# Patient Record
Sex: Male | Born: 1990 | Race: Black or African American | Hispanic: No | Marital: Single | Smoking: Current every day smoker
Health system: Southern US, Community
[De-identification: ages and names within clinical notes are randomized; demographics above are authoritative.]

## PROBLEM LIST (undated history)

## (undated) DIAGNOSIS — F32A Depression, unspecified: Secondary | ICD-10-CM

## (undated) DIAGNOSIS — F329 Major depressive disorder, single episode, unspecified: Secondary | ICD-10-CM

---
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
## 2011-06-16 ENCOUNTER — Emergency Department: Payer: Self-pay | Admitting: Emergency Medicine

## 2011-07-08 IMAGING — CR DG WRIST COMPLETE 3+V*R*
1 series · 4 of 4 positions shown · non-contrast
Comparison: none

REASON FOR EXAM: pain
COMMENTS:

[Series 1: view not recorded · 0.17mm/px · 4 of 4 slices shown]
[im 1/4]
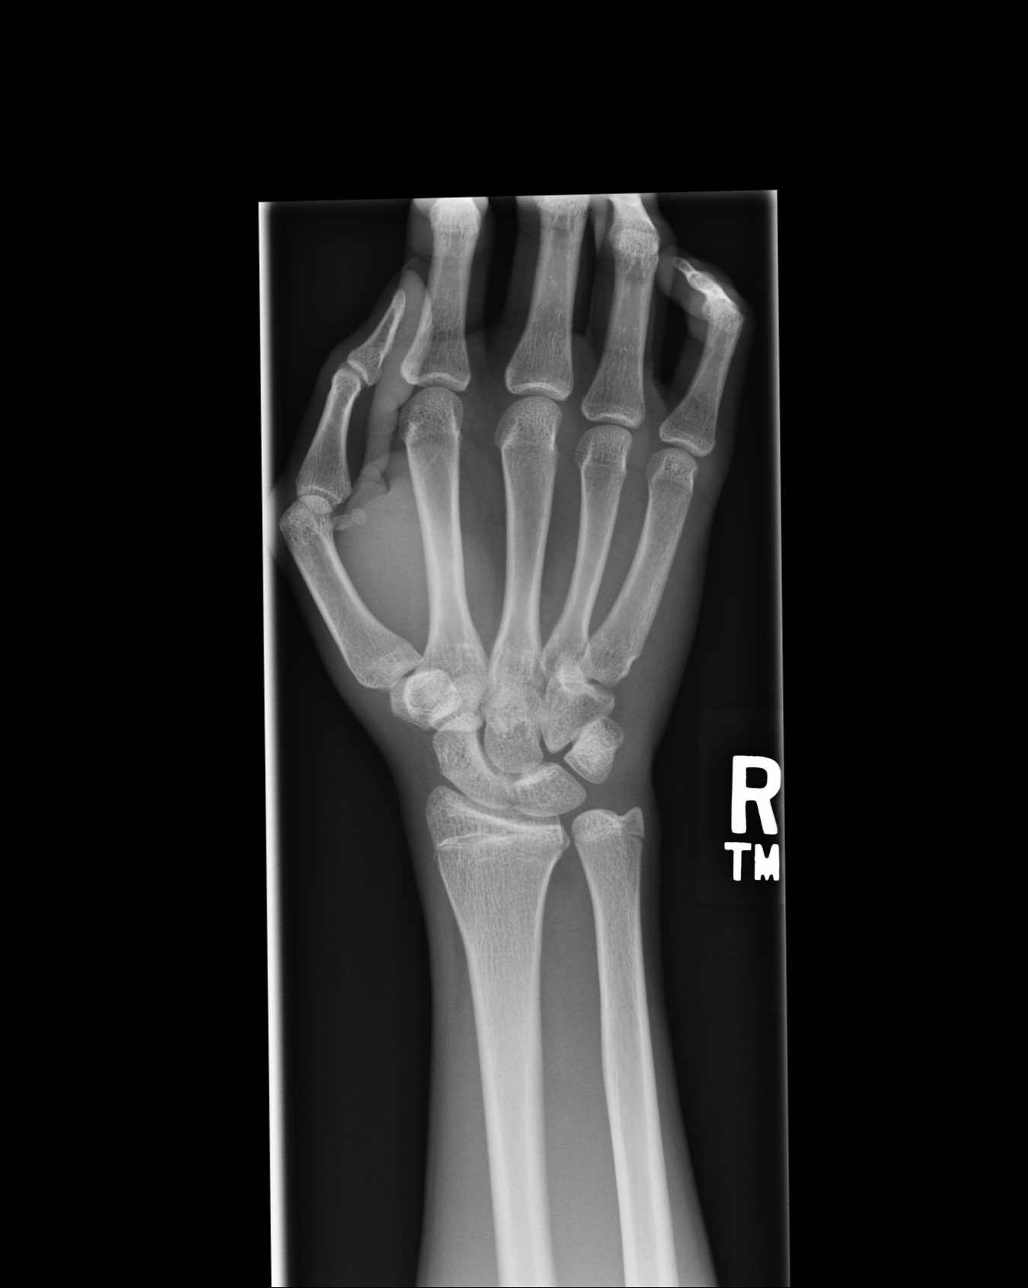
[im 2/4]
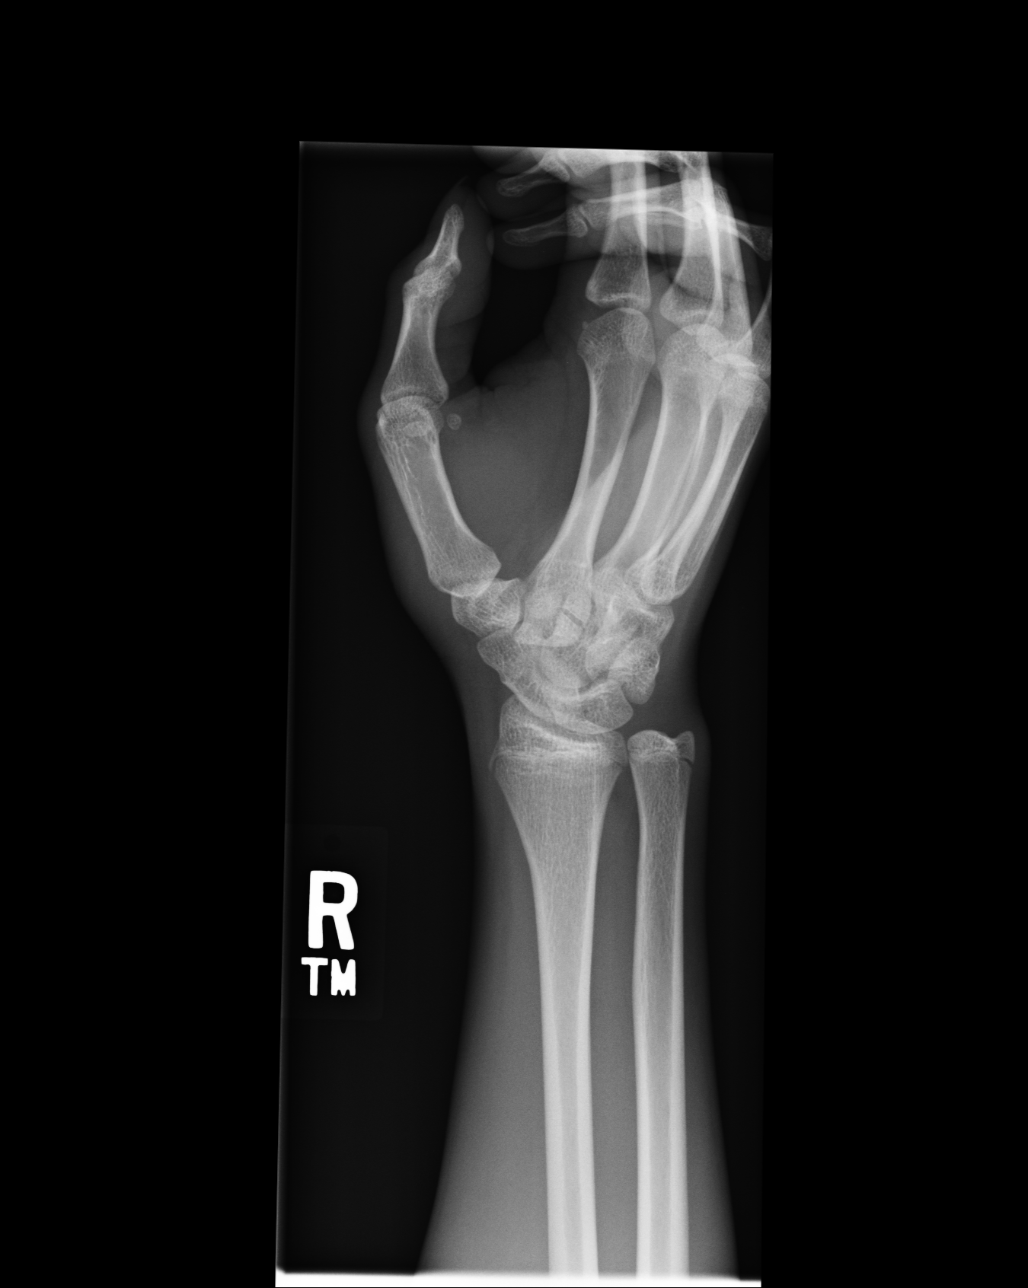
[im 3/4]
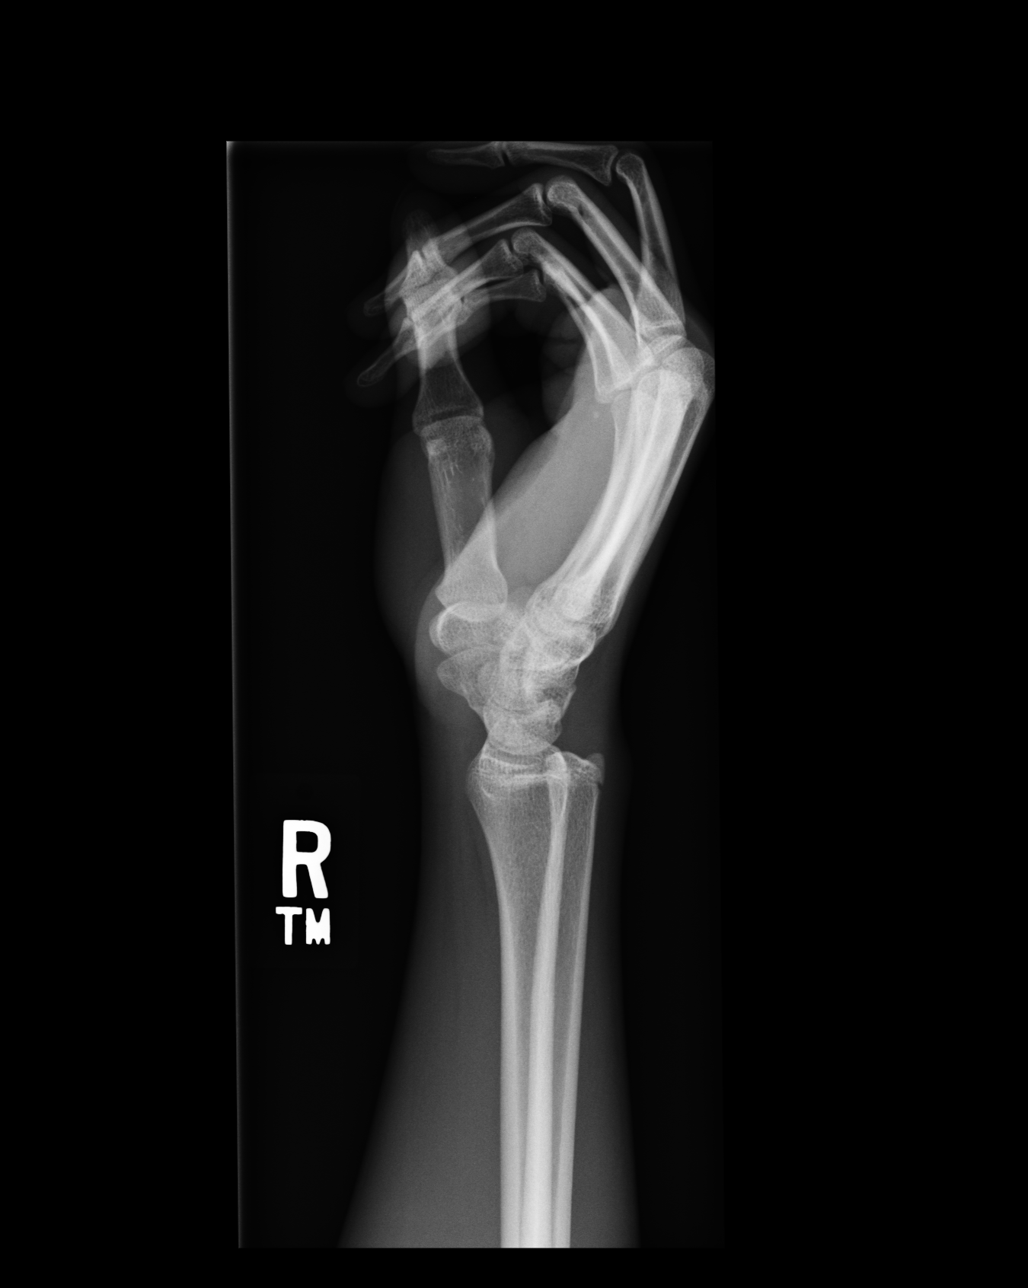
[im 4/4]
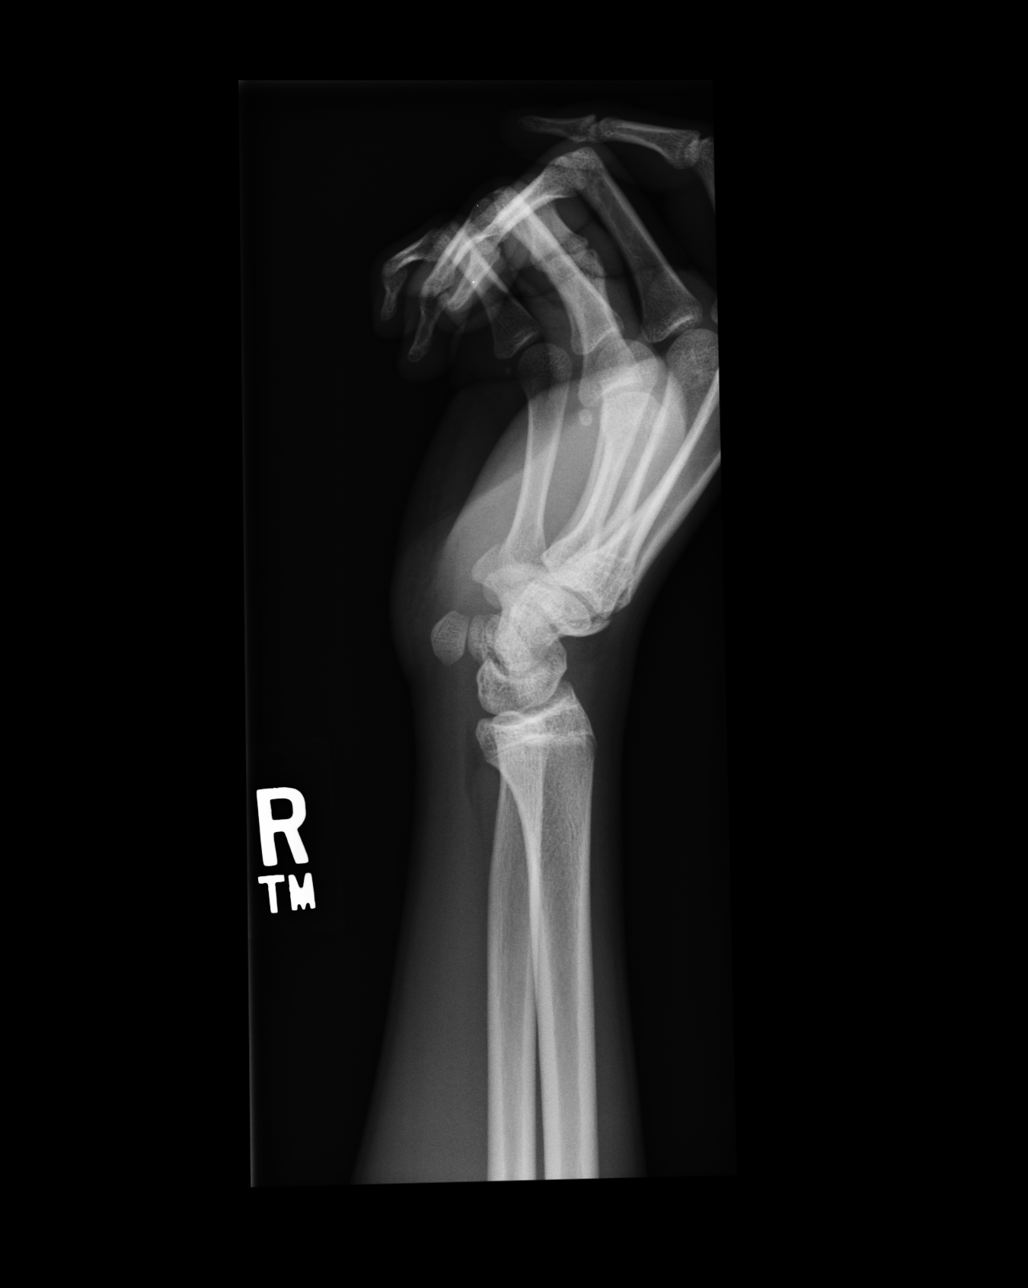

[4 of 4 positions shown; findings below may reference images not displayed]

PROCEDURE:     DXR - DXR WRIST RT COMP WITH OBLIQUES  - November 28, 2009 [DATE]

RESULT:     Images of the right wrist demonstrate no definite fracture,
dislocation or radiopaque foreign body. A fracture through the physis or
growth p[REDACTED] be difficult to detect initially. No subluxation is
appreciated. If the patient has persistent or worsening symptoms then repeat
images would be recommended.
IMPRESSION: No acute bony abnormality evident. Please see above.

## 2012-01-03 ENCOUNTER — Emergency Department: Payer: Self-pay | Admitting: Emergency Medicine

## 2012-04-26 ENCOUNTER — Emergency Department: Payer: Self-pay | Admitting: Emergency Medicine

## 2012-05-29 ENCOUNTER — Encounter (HOSPITAL_COMMUNITY): Payer: Self-pay | Admitting: Emergency Medicine

## 2012-05-29 ENCOUNTER — Emergency Department (HOSPITAL_COMMUNITY)
Admission: EM | Admit: 2012-05-29 | Discharge: 2012-05-29 | Disposition: A | Discharge status: Home / Self Care | Payer: Self-pay | Attending: Emergency Medicine | Admitting: Emergency Medicine

## 2012-05-29 DIAGNOSIS — S50909A Unspecified superficial injury of unspecified elbow, initial encounter: Secondary | ICD-10-CM | POA: Insufficient documentation

## 2012-05-29 DIAGNOSIS — F17200 Nicotine dependence, unspecified, uncomplicated: Secondary | ICD-10-CM | POA: Insufficient documentation

## 2012-05-29 DIAGNOSIS — F329 Major depressive disorder, single episode, unspecified: Secondary | ICD-10-CM | POA: Insufficient documentation

## 2012-05-29 DIAGNOSIS — Z7289 Other problems related to lifestyle: Secondary | ICD-10-CM | POA: Insufficient documentation

## 2012-05-29 DIAGNOSIS — X789XXA Intentional self-harm by unspecified sharp object, initial encounter: Secondary | ICD-10-CM | POA: Insufficient documentation

## 2012-05-29 DIAGNOSIS — IMO0002 Reserved for concepts with insufficient information to code with codable children: Secondary | ICD-10-CM | POA: Insufficient documentation

## 2012-05-29 DIAGNOSIS — F3289 Other specified depressive episodes: Secondary | ICD-10-CM | POA: Insufficient documentation

## 2012-05-29 HISTORY — DX: Major depressive disorder, single episode, unspecified: F32.9

## 2012-05-29 HISTORY — DX: Depression, unspecified: F32.A

## 2012-05-29 LAB — COMPREHENSIVE METABOLIC PANEL
AST: 32 U/L (ref 0–37)
Albumin: 4.6 g/dL (ref 3.5–5.2)
BUN: 12 mg/dL (ref 6–23)
CO2: 27 mEq/L (ref 19–32)
Chloride: 97 mEq/L (ref 96–112)
Glucose, Bld: 87 mg/dL (ref 70–99)
Potassium: 4.9 mEq/L (ref 3.5–5.1)
Sodium: 135 mEq/L (ref 135–145)
Total Bilirubin: 0.4 mg/dL (ref 0.3–1.2)
Total Protein: 8.3 g/dL (ref 6.0–8.3)

## 2012-05-29 LAB — URINALYSIS, ROUTINE W REFLEX MICROSCOPIC
Hgb urine dipstick: NEGATIVE
Ketones, ur: NEGATIVE mg/dL
pH: 7 (ref 5.0–8.0)

## 2012-05-29 LAB — CBC WITH DIFFERENTIAL/PLATELET
Basophils Absolute: 0 10*3/uL (ref 0.0–0.1)
Eosinophils Absolute: 0.1 10*3/uL (ref 0.0–0.7)
HCT: 50 % (ref 39.0–52.0)
Lymphs Abs: 1.3 10*3/uL (ref 0.7–4.0)
MCH: 33.1 pg (ref 26.0–34.0)
MCHC: 35.6 g/dL (ref 30.0–36.0)
Monocytes Absolute: 0.8 10*3/uL (ref 0.1–1.0)

## 2012-05-29 LAB — ETHANOL: Alcohol, Ethyl (B): <11 mg/dL (ref 0–11)

## 2012-05-29 NOTE — ED Notes (Signed)
Per EMS - Pt has 4 supervicial lacerations to left forearm, self inflicted w/ knife immediately PTA. Required no treatment per EMS except dressing. BP - 122, HR - 108, Resp - 24

## 2012-05-29 NOTE — ED Notes (Addendum)
Pt reports feeling angry and depressed for a long time. He said he used to go to counseling when he was younger and mentioned to one of his parents that he'd like to go back but hasn't actually done so. Pt said he wants to be on anti-depressants and in therapy. Pt said that he felt better, no longer angry or depressed after he cut himself. He says this is the first time he's cut himself. Oriented to unit, pt didn't have any questions for Clinical research associate.

## 2012-05-29 NOTE — ED Notes (Signed)
Pt states he is depressed and angry for a couple months, does not relate nay loss but recently moved back in w/ mother. Pt has 4 superficial lacerations to left forearm, and has thoughts when he is very depressed of "wondering why I am alive"
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
## 2012-05-29 NOTE — ED Provider Notes (Addendum)
History     CSN: [id number]  Arrival date & time 05/29/12  1252   First MD Initiated Contact with Patient 05/29/12 1256      Chief Complaint  Patient presents with  . Medical Clearance    (Consider location/radiation/quality/duration/timing/severity/associated sxs/prior treatment) The history is provided by the patient.   21 year old, male, with no significant past medical history presents emergency department with his mother after he scratched his left forearm cutting himself.  He, says that he has been depressed for several weeks.  After scratching his left forearm.  He feels better.  He denies suicidal or homicidal ideations.  He does not take drugs.  He denies alcohol use.  He has never tried to harm himself in the past.  He denies recent illness.  History reviewed. No pertinent past medical history.  History reviewed. No pertinent past surgical history.  No family history on file.  History  Substance Use Topics  . Smoking status: Current Every Day Smoker -- .2 years    Types: Cigarettes  . Smokeless tobacco: Never Used  . Alcohol Use: No      Review of Systems  Psychiatric/Behavioral: Positive for self-injury. Negative for suicidal ideas, hallucinations, confusion and dysphoric mood.       Depression No HI  All other systems reviewed and are negative.    Allergies  Review of patient's allergies indicates no known allergies.  Home Medications  No current outpatient prescriptions on file.  BP 124/74  Pulse 77  Temp 98.7 F (37.1 C) (Oral)  Resp 18  SpO2 100%  Physical Exam  Nursing note and vitals reviewed. Constitutional: He is oriented to person, place, and time. He appears well-developed and well-nourished. No distress.  HENT:  Head: Atraumatic.  Eyes: Conjunctivae normal and EOM are normal.  Neck: Normal range of motion. Neck supple.  Cardiovascular: Normal rate, regular rhythm and intact distal pulses.   No murmur heard. Pulmonary/Chest: Effort  normal and breath sounds normal.  Abdominal: Soft. He exhibits no distension.  Musculoskeletal: Normal range of motion. He exhibits no edema.  Neurological: He is alert and oriented to person, place, and time. No cranial nerve deficit.  Skin: Skin is warm and dry.       Several superficial horizontal scratch marks on the extensor surface of his left forearm.  No lacerations.  Psychiatric: He has a normal mood and affect. His behavior is normal. Judgment and thought content normal.    ED Course  Procedures (including critical care time)  Labs Reviewed  CBC WITH DIFFERENTIAL - Abnormal; Notable for the following:    WBC 11.5 (*)     Hemoglobin 17.8 (*)     Neutrophils Relative 82 (*)     Neutro Abs 9.4 (*)     Lymphocytes Relative 11 (*)     All other components within normal limits  SALICYLATE LEVEL - Abnormal; Notable for the following:    Salicylate Lvl <2.0 (*)     All other components within normal limits  COMPREHENSIVE METABOLIC PANEL  ETHANOL  ACETAMINOPHEN LEVEL  URINE RAPID DRUG SCREEN (HOSP PERFORMED)  URINALYSIS, ROUTINE W REFLEX MICROSCOPIC   No results found.   No diagnosis found.  Depression, with no signs of psychosis, intoxication with drugs or alcohol or active suicidal or homicidal thoughts.   Discussed with act. Terri. She will see pt and make referrals for outpt tx.   6:52 PM Pt was seen by teri from act.  No si/hi.  She concurs and will  give outpt referral MDM  Depression- no signs of psychosis or active suicidal or homicidal thoughts.  No intoxication with drugs or alcohol.        Cheri Guppy, MD 05/29/12 1732  Cheri Guppy, MD 05/29/12 [phone]

## 2013-10-13 ENCOUNTER — Encounter (HOSPITAL_COMMUNITY): Payer: Self-pay | Admitting: Emergency Medicine

## 2013-10-13 ENCOUNTER — Emergency Department (HOSPITAL_COMMUNITY)
Admission: EM | Admit: 2013-10-13 | Discharge: 2013-10-13 | Disposition: A | Discharge status: Home / Self Care | Payer: Self-pay | Attending: Emergency Medicine | Admitting: Emergency Medicine

## 2013-10-13 DIAGNOSIS — F3289 Other specified depressive episodes: Secondary | ICD-10-CM

## 2013-10-13 DIAGNOSIS — F17200 Nicotine dependence, unspecified, uncomplicated: Secondary | ICD-10-CM | POA: Insufficient documentation

## 2013-10-13 DIAGNOSIS — F329 Major depressive disorder, single episode, unspecified: Secondary | ICD-10-CM

## 2013-10-13 DIAGNOSIS — F32A Depression, unspecified: Secondary | ICD-10-CM

## 2013-10-13 DIAGNOSIS — F319 Bipolar disorder, unspecified: Secondary | ICD-10-CM | POA: Insufficient documentation

## 2013-10-13 LAB — BASIC METABOLIC PANEL
BUN: 12 mg/dL (ref 6–23)
CALCIUM: 9.6 mg/dL (ref 8.4–10.5)
CO2: 26 mEq/L (ref 19–32)
Chloride: 100 mEq/L (ref 96–112)
Creatinine, Ser: 1.07 mg/dL (ref 0.50–1.35)
GLUCOSE: 93 mg/dL (ref 70–99)
POTASSIUM: 4.5 meq/L (ref 3.7–5.3)
SODIUM: 137 meq/L (ref 137–147)

## 2013-10-13 LAB — CBC
HEMATOCRIT: 48.3 % (ref 39.0–52.0)
HEMOGLOBIN: 16.7 g/dL (ref 13.0–17.0)
MCH: 32.1 pg (ref 26.0–34.0)
MCHC: 34.6 g/dL (ref 30.0–36.0)
MCV: 92.9 fL (ref 78.0–100.0)
Platelets: 245 10*3/uL (ref 150–400)
RBC: 5.2 MIL/uL (ref 4.22–5.81)
RDW: 13.2 % (ref 11.5–15.5)
WBC: 8.2 10*3/uL (ref 4.0–10.5)

## 2013-10-13 LAB — ETHANOL: Alcohol, Ethyl (B): <11 mg/dL (ref 0–11)

## 2013-10-13 MED ORDER — ZOLPIDEM TARTRATE 5 MG PO TABS: 5.0000 mg | ORAL_TABLET | Freq: Every evening | ORAL | Status: DC | PRN

## 2013-10-13 MED ORDER — ALUM & MAG HYDROXIDE-SIMETH 200-200-20 MG/5ML PO SUSP: 30.0000 mL | ORAL | Status: DC | PRN

## 2013-10-13 MED ORDER — LORAZEPAM 1 MG PO TABS: 1.0000 mg | ORAL_TABLET | Freq: Three times a day (TID) | ORAL | Status: DC | PRN

## 2013-10-13 MED ORDER — ACETAMINOPHEN 325 MG PO TABS: 650.0000 mg | ORAL_TABLET | ORAL | Status: DC | PRN

## 2013-10-13 MED ORDER — ONDANSETRON HCL 4 MG PO TABS: 4.0000 mg | ORAL_TABLET | Freq: Three times a day (TID) | ORAL | Status: DC | PRN

## 2013-10-13 MED ORDER — IBUPROFEN 200 MG PO TABS: 600.0000 mg | ORAL_TABLET | Freq: Three times a day (TID) | ORAL | Status: DC | PRN

## 2013-10-13 MED ORDER — NICOTINE 21 MG/24HR TD PT24: 21.0000 mg | MEDICATED_PATCH | Freq: Every day | TRANSDERMAL | Status: DC

## 2013-10-13 NOTE — ED Notes (Signed)
Pt states that his parole officer wanted him to come to get back on his medications, he says that he hasn't taken them since he got out of jail in January, pt denies SI/HI

## 2013-10-13 NOTE — Progress Notes (Signed)
CSW met with pt a long with psychiatrist. Pt denies SI/HI/AH/VH. Patient reports he has been off his medications since January for bipolar and depression. Patient reports he doesn't remember the medications. Patient referred to follow up with [facility], as [facility] will be providing [facility] follow up for patient and can provide medication management. Pt plans to follow up with probation officer.   Noreene Larsson [phone]  ED CSW 10/13/2013 826am

## 2013-10-13 NOTE — ED Notes (Signed)
Pt given a blanket, pillow and a drink

## 2013-10-13 NOTE — Progress Notes (Signed)
[facility] did not get to see patient but will be sending information about [facility] Orange Card program, using the address provided.  °

## 2013-10-13 NOTE — ED Notes (Signed)
Security @ bedside

## 2013-10-13 NOTE — ED Provider Notes (Signed)
CSN: [id number]     Arrival date & time 10/13/13  0317 History   First MD Initiated Contact with Patient 10/13/13 [phone]     Chief Complaint  Patient presents with  . Medical Clearance     (Consider location/radiation/quality/duration/timing/severity/associated sxs/prior Treatment) HPI Pt with hx of bipolar and depression presenting with c/o feeling that his symptoms are worsening.  He feels more depressed.  He states that he was recently in jail- and was prescribed medications there- he does not know the names of the medications.  He states that his parole officer suggested he come to the ED to get help with his depression and hopefully restarted on his medications.  Denies SI/HI.  Denies substance use, no recent illness- no fever/vomiting/cough.  There are no other associated systemic symptoms, there are no other alleviating or modifying factors.   Past Medical History  Diagnosis Date  . Depression    History reviewed. No pertinent past surgical history. History reviewed. No pertinent family history. History  Substance Use Topics  . Smoking status: Current Every Day Smoker -- .2 years    Types: Cigarettes  . Smokeless tobacco: Never Used  . Alcohol Use: No    Review of Systems ROS reviewed and all otherwise negative except for mentioned in HPI    Allergies  Review of patient's allergies indicates no known allergies.  Home Medications   Current Outpatient Rx  Name  Route  Sig  Dispense  Refill  . ibuprofen (ADVIL,MOTRIN) 200 MG tablet   Oral   Take 400 mg by mouth every 6 (six) hours as needed.          BP 145/85  Pulse 62  Temp(Src) 98.3 F (36.8 C) (Oral)  Resp 20  Ht 5\' 6"  (1.676 m)  Wt 130 lb (58.968 kg)  BMI 20.99 kg/m2  SpO2 100% Vitals reviewed Physical Exam Physical Examination: General appearance - alert, well appearing, and in no distress Mental status - alert, oriented to person, place, and time Eyes - no conjunctival injection, no scleral  icterus Mouth - mucous membranes moist, pharynx normal without lesions Chest - clear to auscultation, no wheezes, rales or rhonchi, symmetric air entry Heart - normal rate, regular rhythm, normal S1, S2, no murmurs, rubs, clicks or gallops Abdomen - soft, nontender, nondistended, no masses or organomegaly Extremities - peripheral pulses normal, no pedal edema, no clubbing or cyanosis Skin - normal coloration and turgor, no rashes Psych- normal mood and affect, no HI/SI  ED Course  Procedures (including critical care time) Labs Review Labs Reviewed  CBC  BASIC METABOLIC PANEL  ETHANOL  URINE RAPID DRUG SCREEN (HOSP PERFORMED)   Imaging Review No results found.   EKG Interpretation None      MDM   Final diagnoses:  Depressive disorder    Pt presenting with request to be started or restarted on medications for bipolar disorder and worsening depressive symptoms.  No SI/HI.  Pt was seen by psych and [facility] ones managing his outpatient medications.  Pt was medically cleared here.  Discharged with strict return precautions.  Pt agreeable with plan.    Ethelda ChickMartha K Linker, MD 10/13/13 [phone]

## 2013-10-13 NOTE — Consult Note (Signed)
[facility] Consult   Reason for Consult:  Requesting medication restart Referring Physician:  ER MD  Aaron Navarro is an 23 y.o. male. Total Time spent with patient: 30 minutes  Assessment: AXIS I:  Depressive Disorder NOS AXIS II:  Deferred AXIS III:   Past Medical History  Diagnosis Date  . Depression    AXIS IV:  economic problems, educational problems, housing problems, occupational problems, problems related to legal system/crime and problems with primary support group AXIS V:  61-70 mild symptoms  Plan:  No evidence of imminent risk to self or others at present.    Subjective:   Aaron Navarro is a 23 y.o. male patient admitted with requesting medication restart.  HPI:  Aaron Navarro says he got out of jail 2 months ago and has not been on his "bipolar" meds. Was taking Celexa and something  Guadalupe Dawn he cannot  Remember. He was at [facility] yesterday but they told him to come back, he says.  His probation officer said that he needed to come to the ER to get his meds.  He is not suicidal or homicidal or psychotic and therefore needs to go back to [place] where they can follow him more closely over time.  His "bipolar" symptoms are all anger, he says.  His family is not supportive and he is close to homeless.  He has a baby on the way.  He has been in jail about 7 times he estimates. HPI Elements:   Location:  depression and anger. Quality:  not suicidal but does not want to be angry so much. Severity:  has not gotten him into any serious problems he says. Timing:  just out of jail 2 months ago. Duration:  several years of anger. Context:  out of meds since released from jail.  Past Psychiatric History: Past Medical History  Diagnosis Date  . Depression     reports that he has been smoking Cigarettes.  He has been smoking about 0.00 packs per day for the past .2 years. He has never used smokeless tobacco. He reports that he does not drink alcohol or use illicit  drugs. History reviewed. No pertinent family history.         Allergies:  No Known Allergies  ACT Assessment Complete:  Yes:    Educational Status    Risk to Self: Risk to self Is patient at risk for suicide?: No  Risk to Others:    Abuse:    Prior Inpatient Therapy:    Prior Outpatient Therapy:    Additional Information:                    Objective: Blood pressure 145/85, pulse 62, temperature 98.3 F (36.8 C), temperature source Oral, resp. rate 20, height 5' 6"  (1.676 m), weight 58.968 kg (130 lb), SpO2 100.00%.Body mass index is 20.99 kg/(m^2). Results for orders placed during the hospital encounter of 10/13/13 (from the past 72 hour(s))  CBC     Status: None   Collection Time    10/13/13  7:35 AM      Result Value Ref Range   WBC 8.2  4.0 - 10.5 K/uL   RBC 5.20  4.22 - 5.81 MIL/uL   Hemoglobin 16.7  13.0 - 17.0 g/dL   HCT 48.3  39.0 - 52.0 %   MCV 92.9  78.0 - 100.0 fL   MCH 32.1  26.0 - 34.0 pg   MCHC 34.6  30.0 - 36.0 g/dL  RDW 13.2  11.5 - 15.5 %   Platelets 245  150 - 400 K/uL  BASIC METABOLIC PANEL     Status: None   Collection Time    10/13/13  7:35 AM      Result Value Ref Range   Sodium 137  137 - 147 mEq/L   Potassium 4.5  3.7 - 5.3 mEq/L   Chloride 100  96 - 112 mEq/L   CO2 26  19 - 32 mEq/L   Glucose, Bld 93  70 - 99 mg/dL   BUN 12  6 - 23 mg/dL   Creatinine, Ser 1.07  0.50 - 1.35 mg/dL   Calcium 9.6  8.4 - 10.5 mg/dL   GFR calc non Af Amer >90  >90 mL/min   GFR calc Af Amer >90  >90 mL/min   Comment: (NOTE)     The eGFR has been calculated using the CKD EPI equation.     This calculation has not been validated in all clinical situations.     eGFR's persistently <90 mL/min signify possible Chronic Kidney     Disease.  ETHANOL     Status: None   Collection Time    10/13/13  7:35 AM      Result Value Ref Range   Alcohol, Ethyl (B) <11  0 - 11 mg/dL   Comment:            LOWEST DETECTABLE LIMIT FOR     SERUM ALCOHOL IS 11  mg/dL     FOR MEDICAL PURPOSES ONLY   Labs are reviewed and are pertinent for no psychiatric issues.  Current Facility-Administered Medications  Medication Dose Route Frequency Provider Last Rate Last Dose  . acetaminophen (TYLENOL) tablet 650 mg  650 mg Oral Q4H PRN Threasa Beards, MD      . alum & mag hydroxide-simeth (MAALOX/MYLANTA) 200-200-20 MG/5ML suspension 30 mL  30 mL Oral PRN Threasa Beards, MD      . ibuprofen (ADVIL,MOTRIN) tablet 600 mg  600 mg Oral Q8H PRN Threasa Beards, MD      . LORazepam (ATIVAN) tablet 1 mg  1 mg Oral Q8H PRN Threasa Beards, MD      . nicotine (NICODERM CQ - dosed in mg/24 hours) patch 21 mg  21 mg Transdermal Daily Threasa Beards, MD      . ondansetron [facility]) tablet 4 mg  4 mg Oral Q8H PRN Threasa Beards, MD      . zolpidem (AMBIEN) tablet 5 mg  5 mg Oral QHS PRN Threasa Beards, MD       Current Outpatient Prescriptions  Medication Sig Dispense Refill  . ibuprofen (ADVIL,MOTRIN) 200 MG tablet Take 400 mg by mouth every 6 (six) hours as needed.        Psychiatric Specialty Exam:     Blood pressure 145/85, pulse 62, temperature 98.3 F (36.8 C), temperature source Oral, resp. rate 20, height 5' 6"  (1.676 m), weight 58.968 kg (130 lb), SpO2 100.00%.Body mass index is 20.99 kg/(m^2).  General Appearance: Casual  Eye Contact::  Good  Speech:  Clear and Coherent  Volume:  Normal  Mood:  Euthymic  Affect:  Appropriate  Thought Process:  Coherent and Logical  Orientation:  Full (Time, Place, and Person)  Thought Content:  Negative  Suicidal Thoughts:  No  Homicidal Thoughts:  No  Memory:  Immediate;   Good Recent;   Good Remote;   Good  Judgement:  Intact  Insight:  Fair  Psychomotor Activity:  Normal  Concentration:  Good  Recall:  Good  Fund of Knowledge:Good  Language: Good  Akathisia:  Negative  Handed:  Right  AIMS (if indicated):     Assets:  Communication Skills Desire for Improvement Housing Physical Health  Sleep:       Musculoskeletal: Strength & Muscle Tone: within normal limits Gait & Station: normal Patient leans: N/A  Treatment Plan Summary: discharge home today to be followed at [facility] for medication management  TAYLOR,GERALD D 10/13/2013 8:32 AM

## 2013-10-13 NOTE — [facility] Suicide Risk Assessment (Signed)
Suicide Risk Assessment  Discharge Assessment     Demographic Factors:  Male, Adolescent or young adult, Low socioeconomic status and Unemployed  Total Time spent with patient: 30 minutes  Psychiatric Specialty Exam:     Blood pressure 145/85, pulse 62, temperature 98.3 F (36.8 C), temperature source Oral, resp. rate 20, height 5\' 6"  (1.676 m), weight 58.968 kg (130 lb), SpO2 100.00%.Body mass index is 20.99 kg/(m^2).  General Appearance: Casual  Eye Contact::  Good  Speech:  Clear and Coherent  Volume:  Normal  Mood:  Euthymic  Affect:  Appropriate  Thought Process:  Coherent  Orientation:  Full (Time, Place, and Person)  Thought Content:  Negative  Suicidal Thoughts:  No  Homicidal Thoughts:  No  Memory:  Immediate;   Good Recent;   Good Remote;   Good  Judgement:  Intact  Insight:  Fair  Psychomotor Activity:  Normal  Concentration:  Good  Recall:  Good  Fund of Knowledge:Good  Language: Good  Akathisia:  Negative  Handed:  Right  AIMS (if indicated):     Assets:  Communication Skills Desire for Improvement Housing Physical Health  Sleep:       Musculoskeletal: Strength & Muscle Tone: within normal limits Gait & Station: normal Patient leans: N/A   Mental Status Per Nursing Assessment::   On Admission:     Current Mental Status by Physician: NA  Loss Factors: NA  Historical Factors: NA  Risk Reduction Factors:   Responsible for children under 118 years of age and Sense of responsibility to family  Continued Clinical Symptoms:  depressed  Cognitive Features That Contribute To Risk:  Closed-mindedness    Suicide Risk:  Minimal: No identifiable suicidal ideation.  Patients presenting with no risk factors but with morbid ruminations; may be classified as minimal risk based on the severity of the depressive symptoms  Discharge Diagnoses:   AXIS I:  Depressive Disorder NOS AXIS II:  Deferred AXIS III:   Past Medical History  Diagnosis Date   . Depression    AXIS IV:  economic problems, educational problems, housing problems, occupational problems and problems related to legal system/crime AXIS V:  61-70 mild symptoms  Plan Of Care/Follow-up recommendations:  Activity:  resume usual activity Diet:  resume usual diet  Is patient on multiple antipsychotic therapies at discharge:  No   Has Patient had three or more failed trials of antipsychotic monotherapy by history:  No  Recommended Plan for Multiple Antipsychotic Therapies: NA    Sawyer Kahan D 10/13/2013, 8:47 AM

## 2013-10-13 NOTE — Consult Note (Signed)
  Review of Systems  Constitutional: Negative.   HENT: Negative.   Eyes: Negative.   Respiratory: Negative.   Cardiovascular: Negative.   Gastrointestinal: Negative.   Genitourinary: Negative.   Musculoskeletal: Negative.   Skin: Negative.   Neurological: Negative.   Endo/Heme/Allergies: Negative.   Psychiatric/Behavioral: Positive for depression.   No physical complaints 

## 2013-10-13 NOTE — ED Notes (Signed)
Pt provided paper scrubs with instruction 

## 2022-01-17 ENCOUNTER — Other Ambulatory Visit: Payer: Self-pay

## 2022-01-17 ENCOUNTER — Encounter: Payer: Self-pay | Admitting: Emergency Medicine

## 2022-01-17 ENCOUNTER — Telehealth: Payer: Self-pay | Admitting: Emergency Medicine

## 2022-01-17 ENCOUNTER — Emergency Department
Admission: EM | Admit: 2022-01-17 | Discharge: 2022-01-17 | Discharge status: Against Medical Advice | Payer: Self-pay | Attending: Emergency Medicine | Admitting: Emergency Medicine

## 2022-01-17 DIAGNOSIS — R109 Unspecified abdominal pain: Secondary | ICD-10-CM | POA: Insufficient documentation

## 2022-01-17 DIAGNOSIS — K0889 Other specified disorders of teeth and supporting structures: Secondary | ICD-10-CM | POA: Insufficient documentation

## 2022-01-17 DIAGNOSIS — Z5321 Procedure and treatment not carried out due to patient leaving prior to being seen by health care provider: Secondary | ICD-10-CM | POA: Insufficient documentation

## 2022-01-17 LAB — COMPREHENSIVE METABOLIC PANEL
ALT: 17 U/L (ref 0–44)
AST: 21 U/L (ref 15–41)
Albumin: 4.6 g/dL (ref 3.5–5.0)
Alkaline Phosphatase: 71 U/L (ref 38–126)
Anion gap: 11 (ref 5–15)
BUN: 10 mg/dL (ref 6–20)
CO2: 23 mmol/L (ref 22–32)
Calcium: 9.3 mg/dL (ref 8.9–10.3)
Chloride: 107 mmol/L (ref 98–111)
Creatinine, Ser: 0.95 mg/dL (ref 0.61–1.24)
GFR, Estimated: >60 mL/min (ref >60)
Glucose, Bld: 104 mg/dL — ABNORMAL HIGH (ref 70–99)
Potassium: 3.4 mmol/L — ABNORMAL LOW (ref 3.5–5.1)
Sodium: 141 mmol/L (ref 135–145)
Total Bilirubin: 0.7 mg/dL (ref 0.3–1.2)
Total Protein: 8 g/dL (ref 6.5–8.1)

## 2022-01-17 LAB — URINALYSIS, ROUTINE W REFLEX MICROSCOPIC
Bacteria, UA: NONE SEEN
Bilirubin Urine: NEGATIVE
Glucose, UA: NEGATIVE mg/dL
Hgb urine dipstick: NEGATIVE
Ketones, ur: NEGATIVE mg/dL
Nitrite: NEGATIVE
Protein, ur: 30 mg/dL — AB
Specific Gravity, Urine: 1.027 (ref 1.005–1.030)
WBC, UA: >50 WBC/hpf — ABNORMAL HIGH (ref 0–5)
pH: 5 (ref 5.0–8.0)

## 2022-01-17 LAB — CBC
HCT: 46.4 % (ref 39.0–52.0)
Hemoglobin: 16.1 g/dL (ref 13.0–17.0)
MCH: 34.2 pg — ABNORMAL HIGH (ref 26.0–34.0)
MCHC: 34.7 g/dL (ref 30.0–36.0)
MCV: 98.5 fL (ref 80.0–100.0)
Platelets: 280 10*3/uL (ref 150–400)
RBC: 4.71 MIL/uL (ref 4.22–5.81)
RDW: 14.4 % (ref 11.5–15.5)
WBC: 17.9 10*3/uL — ABNORMAL HIGH (ref 4.0–10.5)
nRBC: 0 % (ref 0.0–0.2)

## 2022-01-17 LAB — LIPASE, BLOOD: Lipase: 20 U/L (ref 11–51)

## 2022-01-17 LAB — SALICYLATE LEVEL: Salicylate Lvl: <7 mg/dL — ABNORMAL LOW (ref 7.0–30.0)

## 2022-01-17 LAB — CHLAMYDIA/NGC RT PCR (ARMC ONLY)
Chlamydia Tr: DETECTED — AB
N gonorrhoeae: DETECTED — AB

## 2022-01-17 LAB — ACETAMINOPHEN LEVEL: Acetaminophen (Tylenol), Serum: <10 ug/mL — ABNORMAL LOW (ref 10–30)

## 2022-01-17 MED ORDER — DOXYCYCLINE HYCLATE 100 MG PO TABS: 100.0000 mg | ORAL_TABLET | Freq: Once | ORAL | Status: DC

## 2022-01-17 MED ORDER — CEFTRIAXONE SODIUM 1 G IJ SOLR: 500.0000 mg | Freq: Once | INTRAMUSCULAR | Status: DC

## 2022-01-17 NOTE — ED Triage Notes (Signed)
Pt to ED via POV with c/o dental pain. Pt states wisdom tooth is cracked. Pt states is attempting to find a dentist same but has been unable to find one. Pt states has been taking Tylenol and Ibuprofen and vicodin and advil without relief.   Pt reports abd pain due to the pain medication, pt also reports taking more than recommended dose in attempt to relieve pain but has been unsuccessful.

## 2022-01-17 NOTE — Telephone Encounter (Signed)
Attempted to call pts mother to get an updated phone number for pt, number listed for mother is not a correct number.

## 2022-01-17 NOTE — ED Notes (Signed)
This RN attempted to call pt to have him come back in for treatment due to positive test results. Number listed on chart is not in service.

## 2022-01-18 LAB — URINE CULTURE: Culture: <10000 — AB

## 2023-06-03 ENCOUNTER — Encounter: Payer: Medicaid Other | Admitting: Urology

## 2023-06-04 ENCOUNTER — Ambulatory Visit: Payer: Medicaid Other | Admitting: Family Medicine

## 2023-06-11 ENCOUNTER — Encounter: Payer: Medicaid Other | Admitting: Urology

## 2023-06-11 NOTE — Progress Notes (Deleted)
   Assessment: No diagnosis found.   Plan: ***  Chief Complaint: No chief complaint on file.   History of Present Illness:  Aaron Navarro is a 32 y.o. male who is seen in consultation from Pcp, No for evaluation of ***.   Past Medical History:  Past Medical History:  Diagnosis Date   Depression     Past Surgical History:  No past surgical history on file.  Allergies:  No Known Allergies  Family History:  No family history on file.  Social History:  Social History   Tobacco Use   Smoking status: Every Day    Types: Cigarettes   Smokeless tobacco: Never  Substance Use Topics   Alcohol use: No   Drug use: No    Review of symptoms:  Constitutional:  Negative for unexplained weight loss, night sweats, fever, chills ENT:  Negative for nose bleeds, sinus pain, painful swallowing CV:  Negative for chest pain, shortness of breath, exercise intolerance, palpitations, loss of consciousness Resp:  Negative for cough, wheezing, shortness of breath GI:  Negative for nausea, vomiting, diarrhea, bloody stools GU:  Positives noted in HPI; otherwise negative for gross hematuria, dysuria, urinary incontinence Neuro:  Negative for seizures, poor balance, limb weakness, slurred speech Psych:  Negative for lack of energy, depression, anxiety Endocrine:  Negative for polydipsia, polyuria, symptoms of hypoglycemia (dizziness, hunger, sweating) Hematologic:  Negative for anemia, purpura, petechia, prolonged or excessive bleeding, use of anticoagulants  Allergic:  Negative for difficulty breathing or choking as a result of exposure to anything; no shellfish allergy; no allergic response (rash/itch) to materials, foods  Physical exam: There were no vitals taken for this visit. GENERAL APPEARANCE:  Well appearing, well developed, well nourished, NAD HEENT: Atraumatic, Normocephalic, oropharynx clear. NECK: Supple without lymphadenopathy or thyromegaly. LUNGS: Clear to auscultation  bilaterally. HEART: Regular Rate and Rhythm without murmurs, gallops, or rubs. ABDOMEN: Soft, non-tender, No Masses. EXTREMITIES: Moves all extremities well.  Without clubbing, cyanosis, or edema. NEUROLOGIC:  Alert and oriented x 3, normal gait, CN II-XII grossly intact.  MENTAL STATUS:  Appropriate. BACK:  Non-tender to palpation.  No CVAT SKIN:  Warm, dry and intact.    Results: No results found for this or any previous visit (from the past 24 hour(s)).

## 2023-06-12 ENCOUNTER — Ambulatory Visit: Payer: Medicaid Other | Admitting: Family Medicine

## 2023-07-02 ENCOUNTER — Encounter: Payer: Self-pay | Admitting: Urology

## 2023-07-02 ENCOUNTER — Ambulatory Visit: Payer: Medicaid Other | Admitting: Urology

## 2023-07-02 VITALS — BP 134/86 | Ht 66.0 in | Wt 133.0 lb

## 2023-07-02 DIAGNOSIS — Z3141 Encounter for fertility testing: Secondary | ICD-10-CM

## 2023-07-02 NOTE — Progress Notes (Signed)
   07/02/23 2:30 PM   Aaron Navarro March 13, 1991 [id number]  CC: Fertility questions  HPI: 32 year old male who made an appointment today for fertility concerns.  He is wondering if he is fertile.  He does not have any problems with erections or ejaculations.  He is a challenging historian, unclear if he currently has a partner is actively trying for pregnant.   PMH: Past Medical History:  Diagnosis Date   Depression     Family History: History reviewed. No pertinent family history.  Social History:  reports that he has been smoking cigarettes. He has never used smokeless tobacco. He reports that he does not drink alcohol and does not use drugs.  Physical Exam: BP 134/86 (BP Location: Left Arm, Patient Position: Sitting, Cuff Size: Normal)   Ht 5\' 6"  (1.676 m)   Wt 133 lb (60.3 kg)   BMI 21.47 kg/m    Constitutional:  Alert and oriented, No acute distress. Cardiovascular: No clubbing, cyanosis, or edema. Respiratory: Normal respiratory effort, no increased work of breathing. GI: Abdomen is soft, nontender, nondistended, no abdominal masses GU: Circumcised phallus with patent meatus, no lesions, right testicle 20 cc and descended without masses or lesions, left testicle 15 cc, distended, no masses.  Vas deferens easily palpable bilateral  Assessment & Plan:   32 year old male inquiring about his fertility.  I recommended starting with a semen analysis, and if any abnormal findings can consider further evaluation with testosterone, LH, FSH blood work.  Contact with semen analysis results  Legrand Rams, MD 07/02/2023  [facility] [address] [place], [place] [address] [phone]

## 2023-07-10 ENCOUNTER — Telehealth: Payer: Self-pay | Admitting: Urology

## 2023-07-10 ENCOUNTER — Other Ambulatory Visit: Payer: Self-pay | Admitting: Urology

## 2023-07-10 LAB — SEMEN ANALYSIS, BASIC
Appearance: NORMAL
Concentration, Sperm: 20 x10E6/mL (ref >14.9)
Immotile Sperm: 34 %
Leukocyte Concentration: >1 x10E6/mL — ABNORMAL HIGH (ref <1.00)
Non-Progressive (NP): 22 %
Normal Morphology-Strict: 11 % (ref >3)
Progressive Motility (PR): 44 % (ref >31)
Progressively Motile Sperm: 17.8 x10E6
Time Collected: 930
Time Received: 1000
Time Since Last Emission: 3 d
Total Motile Sperm: 26.4 x10E6
Total Motility (PR+NP): 66 % (ref >39)
Total Sperm in Ejaculate: 40 x10E6 (ref >38.9)
Volume: 2 mL (ref >1.4)
pH: 8.5 (ref >7.1)

## 2023-07-10 NOTE — Telephone Encounter (Signed)
[name] with [facility] called and lvm that patient dropped off semen analysis sample. She said it was ordered as a post vasectomy semen analysis, and patient told her he hasn't had a vasectomy. She asked that we call her to advise how to run sample. She left 2 phone numbers to reach her at: [phone] or [phone].

## 2023-07-10 NOTE — Telephone Encounter (Signed)
Called [name] at [facility] advised her ok to run test [id number] for basic semen analysis. Lab voiced understanding.

## 2023-09-11 ENCOUNTER — Encounter: Payer: Self-pay | Admitting: Emergency Medicine

## 2023-09-11 ENCOUNTER — Emergency Department
Admission: EM | Admit: 2023-09-11 | Discharge: 2023-09-12 | Disposition: A | Discharge status: Home / Self Care | Payer: Medicaid Other | Attending: Emergency Medicine | Admitting: Emergency Medicine

## 2023-09-11 ENCOUNTER — Other Ambulatory Visit: Payer: Self-pay

## 2023-09-11 DIAGNOSIS — S0181XA Laceration without foreign body of other part of head, initial encounter: Secondary | ICD-10-CM | POA: Diagnosis not present

## 2023-09-11 DIAGNOSIS — S0990XA Unspecified injury of head, initial encounter: Secondary | ICD-10-CM

## 2023-09-11 DIAGNOSIS — Y92810 Car as the place of occurrence of the external cause: Secondary | ICD-10-CM | POA: Insufficient documentation

## 2023-09-11 DIAGNOSIS — W228XXA Striking against or struck by other objects, initial encounter: Secondary | ICD-10-CM | POA: Diagnosis not present

## 2023-09-11 MED ORDER — LIDOCAINE-EPINEPHRINE-TETRACAINE (LET) TOPICAL GEL
3.0000 mL | Freq: Once | TOPICAL | Status: AC
  Administered 2023-09-12: 3 mL via TOPICAL
  Filled 2023-09-11: qty 3

## 2023-09-11 NOTE — ED Triage Notes (Signed)
Pt reports he hit his head on the car, pt has am abrasion to left side of his forehead. Pt talking on the phone while being triaged no apparent distress.

## 2023-09-11 NOTE — ED Provider Notes (Signed)
[facility] Provider Note    Event Date/Time   First MD Initiated Contact with Patient 09/11/23 2319     (approximate)   History   Head Injury   HPI  Aaron Navarro is a 33 y.o. male   Past medical history of no significant past medical history here with a head injury after accidentally hitting his head on the car frame while try to get out of the car.  He has an abrasion to the left cheek area as well as a small laceration to the left side of his forehead that is hemostatic with direct pressure.  He did not lose consciousness did not vomit does not use anticoagulation, and has no other injuries reported.  He self presented.  No other acute medical complaints.    Physical Exam   Triage Vital Signs: ED Triage Vitals [09/11/23 2249]  Encounter Vitals Group     BP (!) 149/98     Systolic BP Percentile      Diastolic BP Percentile      Pulse Rate (!) 109     Resp 16     Temp 98.5 F (36.9 C)     Temp Source Oral     SpO2      Weight 130 lb (59 kg)     Height 5\' 6"  (1.676 m)     Head Circumference      Peak Flow      Pain Score 8     Pain Loc      Pain Education      Exclude from Growth Chart     Most recent vital signs: Vitals:   09/11/23 2249  BP: (!) 149/98  Pulse: (!) 109  Resp: 16  Temp: 98.5 F (36.9 C)    General: Awake, no distress.  CV:  Good peripheral perfusion.  Resp:  Normal effort.  Abd:  No distention.  Other:  Very small abrasion to the left cheek and a very small laceration to the left forehead with a hematoma underneath.  No evidence of depressed skull fracture, no other facial tenderness noted, no malocclusion, neck supple full range of motion no midline tenderness.  Moving all extremities with full active range of motion.   ED Results / Procedures / Treatments   Labs (all labs ordered are listed, but only abnormal results are displayed) Labs Reviewed - No data to display  PROCEDURES:  Critical Care performed:  No  .Laceration Repair  Date/Time: 09/12/2023 12:01 AM  Performed by: Pilar Jarvis, MD Authorized by: Pilar Jarvis, MD   Consent:    Consent obtained:  Verbal   Consent given by:  Patient   Risks discussed:  Infection, pain, poor cosmetic result and poor wound healing   Alternatives discussed:  No treatment and delayed treatment Universal protocol:    Procedure explained and questions answered to patient or proxy's satisfaction: yes     Patient identity confirmed:  Verbally with patient Laceration details:    Location:  Face   Face location:  Forehead   Length (cm):  0.5   Depth (mm):  1    MEDICATIONS ORDERED IN ED: Medications  lidocaine-EPINEPHrine-tetracaine (LET) topical gel (has no administration in time range)    IMPRESSION / MDM / ASSESSMENT AND PLAN / ED COURSE  I reviewed the triage vital signs and the nursing notes.  Patient's presentation is most consistent with acute presentation with potential threat to life or bodily function.  Differential diagnosis includes, but is not limited to, head injury including laceration, abrasion, skull fracture ICH considered but less likely  MDM:    Low mechanism head injury with an abrasion and laceration repaired as above.  [place] head CT rule low risk defer imaging.  No other signs of acute trauma on my exam, nor reported by patient.  He really wants to avoid stitches and thus was repaired sans stitches as above.  Anticipatory guidance given and he will be discharged.         FINAL CLINICAL IMPRESSION(S) / ED DIAGNOSES   Final diagnoses:  Minor head injury, initial encounter  Forehead laceration, initial encounter     Rx / DC Orders   ED Discharge Orders     None        Note:  This document was prepared using Dragon voice recognition software and may include unintentional dictation errors.    Pilar Jarvis, MD 09/12/23 [phone]

## 2023-09-12 ENCOUNTER — Encounter: Payer: Self-pay | Admitting: Emergency Medicine

## 2023-09-12 NOTE — Discharge Instructions (Signed)
 Take acetaminophen 650 mg and ibuprofen 400 mg every 6 hours for pain.  Take with food. Please keep your wound clean by washing at least daily with soap and water. If you see any signs of infection like spreading redness, pus coming from the wound, extreme pain, fevers, chills or any other worsening doctor right away or come back to the emergency department Thank you for choosing [place] for your health care today!  Please see your primary doctor this week for a follow up appointment.   If you have any new, worsening, or unexpected symptoms call your doctor right away or come back to the emergency department for reevaluation.  It was my pleasure to care for you today.   Daneil Dan Modesto Charon, MD

## 2024-02-08 ENCOUNTER — Ambulatory Visit: Admitting: Family Medicine

## 2024-04-18 ENCOUNTER — Emergency Department

## 2024-04-18 ENCOUNTER — Encounter: Payer: Self-pay | Admitting: Emergency Medicine

## 2024-04-18 ENCOUNTER — Other Ambulatory Visit: Payer: Self-pay

## 2024-04-18 ENCOUNTER — Emergency Department
Admission: EM | Admit: 2024-04-18 | Discharge: 2024-04-18 | Disposition: A | Discharge status: Home / Self Care | Attending: Emergency Medicine | Admitting: Emergency Medicine

## 2024-04-18 DIAGNOSIS — B9789 Other viral agents as the cause of diseases classified elsewhere: Secondary | ICD-10-CM | POA: Diagnosis not present

## 2024-04-18 DIAGNOSIS — R509 Fever, unspecified: Secondary | ICD-10-CM | POA: Diagnosis present

## 2024-04-18 DIAGNOSIS — J988 Other specified respiratory disorders: Secondary | ICD-10-CM | POA: Insufficient documentation

## 2024-04-18 DIAGNOSIS — D72829 Elevated white blood cell count, unspecified: Secondary | ICD-10-CM | POA: Insufficient documentation

## 2024-04-18 LAB — HEPATIC FUNCTION PANEL
ALT: 15 U/L (ref 0–44)
AST: 23 U/L (ref 15–41)
Albumin: 3.7 g/dL (ref 3.5–5.0)
Alkaline Phosphatase: 57 U/L (ref 38–126)
Bilirubin, Direct: 0.2 mg/dL (ref 0.0–0.2)
Indirect Bilirubin: 0.8 mg/dL (ref 0.3–0.9)
Total Bilirubin: 1 mg/dL (ref 0.0–1.2)
Total Protein: 8 g/dL (ref 6.5–8.1)

## 2024-04-18 LAB — CBC WITH DIFFERENTIAL/PLATELET
Abs Immature Granulocytes: 0.08 K/uL — ABNORMAL HIGH (ref 0.00–0.07)
Basophils Absolute: 0.1 K/uL (ref 0.0–0.1)
Basophils Relative: 0 %
Eosinophils Absolute: 0 K/uL (ref 0.0–0.5)
Eosinophils Relative: 0 %
HCT: 46.1 % (ref 39.0–52.0)
Hemoglobin: 15.7 g/dL (ref 13.0–17.0)
Immature Granulocytes: 1 %
Lymphocytes Relative: 13 %
Lymphs Abs: 2 K/uL (ref 0.7–4.0)
MCH: 34.2 pg — ABNORMAL HIGH (ref 26.0–34.0)
MCHC: 34.1 g/dL (ref 30.0–36.0)
MCV: 100.4 fL — ABNORMAL HIGH (ref 80.0–100.0)
Monocytes Absolute: 1.6 K/uL — ABNORMAL HIGH (ref 0.1–1.0)
Monocytes Relative: 10 %
Neutro Abs: 12.3 K/uL — ABNORMAL HIGH (ref 1.7–7.7)
Neutrophils Relative %: 76 %
Platelets: 304 K/uL (ref 150–400)
RBC: 4.59 MIL/uL (ref 4.22–5.81)
RDW: 13.2 % (ref 11.5–15.5)
WBC: 16.1 K/uL — ABNORMAL HIGH (ref 4.0–10.5)
nRBC: 0 % (ref 0.0–0.2)

## 2024-04-18 LAB — BASIC METABOLIC PANEL WITH GFR
Anion gap: 13 (ref 5–15)
BUN: 10 mg/dL (ref 6–20)
CO2: 23 mmol/L (ref 22–32)
Calcium: 9.1 mg/dL (ref 8.9–10.3)
Chloride: 102 mmol/L (ref 98–111)
Creatinine, Ser: 1.06 mg/dL (ref 0.61–1.24)
GFR, Estimated: >60 mL/min (ref >60)
Glucose, Bld: 92 mg/dL (ref 70–99)
Potassium: 4.2 mmol/L (ref 3.5–5.1)
Sodium: 138 mmol/L (ref 135–145)

## 2024-04-18 LAB — TROPONIN I (HIGH SENSITIVITY): Troponin I (High Sensitivity): 5 ng/L (ref <18)

## 2024-04-18 LAB — LACTIC ACID, PLASMA: Lactic Acid, Venous: 1 mmol/L (ref 0.5–1.9)

## 2024-04-18 LAB — PROTIME-INR
INR: 1.1 (ref 0.8–1.2)
Prothrombin Time: 14.6 s (ref 11.4–15.2)

## 2024-04-18 LAB — RESP PANEL BY RT-PCR (RSV, FLU A&B, COVID)  RVPGX2
Influenza A by PCR: NEGATIVE
Influenza B by PCR: NEGATIVE
Resp Syncytial Virus by PCR: NEGATIVE
SARS Coronavirus 2 by RT PCR: NEGATIVE

## 2024-04-18 LAB — GROUP A STREP BY PCR: Group A Strep by PCR: NOT DETECTED

## 2024-04-18 MED ORDER — ACETAMINOPHEN 325 MG PO TABS
650.0000 mg | ORAL_TABLET | Freq: Once | ORAL | Status: AC | PRN
  Administered 2024-04-18: 650 mg via ORAL
  Filled 2024-04-18: qty 2

## 2024-04-18 MED ORDER — IOHEXOL 300 MG/ML  SOLN
75.0000 mL | Freq: Once | INTRAMUSCULAR | Status: AC | PRN
  Administered 2024-04-18: 75 mL via INTRAVENOUS

## 2024-04-18 MED ORDER — SODIUM CHLORIDE 0.9 % IV BOLUS
1000.0000 mL | Freq: Once | INTRAVENOUS | Status: AC
  Administered 2024-04-18: 1000 mL via INTRAVENOUS

## 2024-04-18 MED ORDER — KETOROLAC TROMETHAMINE 15 MG/ML IJ SOLN
15.0000 mg | Freq: Once | INTRAMUSCULAR | Status: AC
  Administered 2024-04-18: 15 mg via INTRAVENOUS
  Filled 2024-04-18: qty 1

## 2024-04-18 MED ORDER — DEXAMETHASONE SODIUM PHOSPHATE 10 MG/ML IJ SOLN
10.0000 mg | Freq: Once | INTRAMUSCULAR | Status: AC
  Administered 2024-04-18: 10 mg via INTRAVENOUS
  Filled 2024-04-18: qty 1

## 2024-04-18 MED ORDER — ONDANSETRON HCL 4 MG/2ML IJ SOLN
4.0000 mg | Freq: Once | INTRAMUSCULAR | Status: AC
  Administered 2024-04-18: 4 mg via INTRAVENOUS
  Filled 2024-04-18: qty 2

## 2024-04-18 NOTE — ED Triage Notes (Signed)
 C/O painful productive cough, sore throat, body aches x 1 week.

## 2024-04-18 NOTE — ED Notes (Signed)
 Patients denies being allergic to decadron. States having some congestion. When asked did rate pain. Awaken from sleep.

## 2024-04-18 NOTE — ED Notes (Signed)
 Patient transported to X-ray by XR staff.

## 2024-04-18 NOTE — Sepsis Progress Note (Signed)
 Sepsis protocol is being followed by [facility].

## 2024-04-18 NOTE — Discharge Instructions (Addendum)
Please follow-up with your outpatient provider.  Please return for any new, worsening, or change in symptoms or other concerns.  It was a pleasure caring for you today. 

## 2024-04-18 NOTE — ED Provider Notes (Signed)
 Shared visit  Presents to the emergency department with 1 week of sore throat, congestion, chest pain and shortness of breath.  On arrival febrile to 100.6.  Tachycardic.  Does have leukocytosis of 16.  Chest x-ray with no signs of pneumonia.  No significant electrolyte abnormality.  COVID and influenza testing are negative.  Group a strep is negative.  Adding on a Monospot.  Will give IV fluids and antiemetics.  Adding on a troponin to further evaluate for possible myocarditis although have of much lower suspicion.  No positional changes of his chest pain.  Low suspicion for pulmonary embolism, does not have any risk factors but is tachycardic with a low-grade fever with some shortness of breath, not pleuritic in nature.  On reevaluation feeling better.  CT scan of the soft tissue neck with no signs of an abscess.  No concern for myocarditis, negative troponin.  Improvement of his symptoms.  Normal lactic acid.  Discussed close follow-up as an outpatient.  Given return precautions for any ongoing or worsening symptoms.   Suzanne Kirsch, MD 04/18/24 1445

## 2024-04-18 NOTE — ED Notes (Signed)
 See triage note  Presents with low grade fever and prod cough   States this started about 1 week ago

## 2024-04-18 NOTE — ED Provider Notes (Signed)
 [facility] Provider Note    Event Date/Time   First MD Initiated Contact with Patient 04/18/24 [phone]     (approximate)   History   Fever   HPI  Aaron Navarro is a 33 y.o. male who presents today for evaluation of cough, sore throat, fever, body aches for the past several days.  Patient is unsure of any sick contacts.  He reports he has right sided throat discomfort that radiates into his upper chest.  No shortness of breath.  No difficulty breathing or swallowing.  No voice change.  There are no active problems to display for this patient.         Physical Exam   Triage Vital Signs: ED Triage Vitals  Encounter Vitals Group     BP 04/18/24 0939 (!) 139/98     Girls Systolic BP Percentile --      Girls Diastolic BP Percentile --      Boys Systolic BP Percentile --      Boys Diastolic BP Percentile --      Pulse Rate 04/18/24 0939 (!) 121     Resp 04/18/24 0939 16     Temp 04/18/24 0939 (!) 100.6 F (38.1 C)     Temp Source 04/18/24 0939 Oral     SpO2 04/18/24 0939 100 %     Weight 04/18/24 0940 130 lb 1.1 oz (59 kg)     Height 04/18/24 0952 5' 6 (1.676 m)     Head Circumference --      Peak Flow --      Pain Score 04/18/24 0940 8     Pain Loc --      Pain Education --      Exclude from Growth Chart --     Most recent vital signs: Vitals:   04/18/24 1148 04/18/24 1448  BP: 130/89 128/88  Pulse: 83 80  Resp: 16 16  Temp: 99 F (37.2 C) 98.7 F (37.1 C)  SpO2: 99% 99%    Physical Exam Vitals and nursing note reviewed.  Constitutional:      General: Awake and alert. No acute distress.    Appearance: Normal appearance. The patient is normal weight.  HENT:     Head: Normocephalic and atraumatic.     Mouth: Mucous membranes are moist. Uvula midline.  No tonsillar exudate.  Posterior oropharyngeal erythema present.  No soft palate fluctuance.  No trismus.  No voice change.  No sublingual swelling.  No tender cervical  lymphadenopathy.  No nuchal rigidity Eyes:     General: PERRL. Normal EOMs        Right eye: No discharge.        Left eye: No discharge.     Conjunctiva/sclera: Conjunctivae normal.  Cardiovascular:     Rate and Rhythm: Normal rate and regular rhythm.     Pulses: Normal pulses.     Heart sounds: Normal heart sounds Pulmonary:     Effort: Pulmonary effort is normal. No respiratory distress.     Breath sounds: Normal breath sounds.  Abdominal:     Abdomen is soft. There is no abdominal tenderness. No rebound or guarding. No distention. Musculoskeletal:        General: No swelling. Normal range of motion.     Cervical back: Normal range of motion and neck supple.  Skin:    General: Skin is warm and dry.     Capillary Refill: Capillary refill takes less than 2 seconds.  Findings: No rash.  Neurological:     Mental Status: The patient is awake and alert.      ED Results / Procedures / Treatments   Labs (all labs ordered are listed, but only abnormal results are displayed) Labs Reviewed  CBC WITH DIFFERENTIAL/PLATELET - Abnormal; Notable for the following components:      Result Value   WBC 16.1 (*)    MCV 100.4 (*)    MCH 34.2 (*)    Neutro Abs 12.3 (*)    Monocytes Absolute 1.6 (*)    Abs Immature Granulocytes 0.08 (*)    All other components within normal limits  GROUP A STREP BY PCR  RESP PANEL BY RT-PCR (RSV, FLU A&B, COVID)  [id number]  CULTURE, BLOOD (ROUTINE X 2)  CULTURE, BLOOD (ROUTINE X 2)  BASIC METABOLIC PANEL WITH GFR  LACTIC ACID, PLASMA  PROTIME-INR  HEPATIC FUNCTION PANEL  URINALYSIS, W/ REFLEX TO CULTURE (INFECTION SUSPECTED)  MONONUCLEOSIS SCREEN  TROPONIN I (HIGH SENSITIVITY)     EKG     RADIOLOGY I independently reviewed and interpreted imaging and agree with radiologists findings.     PROCEDURES:  Critical Care performed:   Procedures   MEDICATIONS ORDERED IN ED: Medications  acetaminophen  (TYLENOL ) tablet 650 mg (650 mg Oral  Given 04/18/24 0944)  ketorolac (TORADOL) 15 MG/ML injection 15 mg (15 mg Intravenous Given 04/18/24 1033)  sodium chloride 0.9 % bolus 1,000 mL (0 mLs Intravenous Stopped 04/18/24 1148)  ondansetron  (ZOFRAN ) injection 4 mg (4 mg Intravenous Given 04/18/24 1033)  dexamethasone (DECADRON) injection 10 mg (10 mg Intravenous Given 04/18/24 1307)  iohexol (OMNIPAQUE) 300 MG/ML solution 75 mL (75 mLs Intravenous Contrast Given 04/18/24 1315)     IMPRESSION / MDM / ASSESSMENT AND PLAN / ED COURSE  I reviewed the triage vital signs and the nursing notes.   Differential diagnosis includes, but is not limited to, COVID, influenza, strep pharyngitis, RPA, PTA, mononucleosis.  Patient is febrile, tachycardic, though normotensive and with a normal oxygen saturation 100% on room air.  Further workup is indicated.  Given his tachycardia and fever, sepsis order set was initiated.  Labs are overall reassuring with the exception of a leukocytosis to 16.  Strep, COVID, flu, RSV are all negative.  I have added on a troponin to assess for myocarditis.  There is no pleuritic nature to his pain to suggest pulmonary embolism, and he does not feel short of breath.  No calf pain or leg swelling, no history of PE or DVT.  Chest x-ray is negative for cardiopulmonary abnormality.  Abdomen is soft and nontender throughout, do not suspect intra-abdominal process.  LFTs are normal.  Urinalysis does not suggest infection either.  Lactate is normal.  He denies any recent tick bites or outdoor activities to raise suspicion for tickborne illness.  Normal platelets.  He was treated symptomatically with IV fluids, Toradol, Tylenol , and Decadron with improvement of his symptoms.  Given his right sided throat discomfort, CT soft tissue neck obtained for evaluation of possible RPA.  CT soft tissue neck was negative for abscess.  Patient reports feeling improved upon reevaluation.  He feels comfortable with discharge home.  We discussed  strict return precautions and the importance of close outpatient follow-up.  Patient understands and agrees with plan.  He was discharged in stable condition.  Case was discussed with Dr. Suzanne who also evaluated the patient and agrees with assessment and plan.  Patient's presentation is most consistent with acute presentation with potential  threat to life or bodily function.     FINAL CLINICAL IMPRESSION(S) / ED DIAGNOSES   Final diagnoses:  Febrile illness  Viral respiratory illness     Rx / DC Orders   ED Discharge Orders     None        Note:  This document was prepared using Dragon voice recognition software and may include unintentional dictation errors.   Charlii Yost E, PA-C 04/18/24 1458    Suzanne Kirsch, MD 04/18/24 1601

## 2024-04-20 ENCOUNTER — Emergency Department

## 2024-04-20 ENCOUNTER — Emergency Department
Admission: EM | Admit: 2024-04-20 | Discharge: 2024-04-20 | Disposition: A | Discharge status: Home / Self Care | Attending: Emergency Medicine | Admitting: Emergency Medicine

## 2024-04-20 ENCOUNTER — Ambulatory Visit: Payer: Self-pay

## 2024-04-20 ENCOUNTER — Other Ambulatory Visit: Payer: Self-pay

## 2024-04-20 DIAGNOSIS — R062 Wheezing: Secondary | ICD-10-CM

## 2024-04-20 DIAGNOSIS — B349 Viral infection, unspecified: Secondary | ICD-10-CM | POA: Insufficient documentation

## 2024-04-20 DIAGNOSIS — H9209 Otalgia, unspecified ear: Secondary | ICD-10-CM | POA: Insufficient documentation

## 2024-04-20 DIAGNOSIS — D72829 Elevated white blood cell count, unspecified: Secondary | ICD-10-CM | POA: Diagnosis not present

## 2024-04-20 DIAGNOSIS — J029 Acute pharyngitis, unspecified: Secondary | ICD-10-CM | POA: Diagnosis not present

## 2024-04-20 DIAGNOSIS — R059 Cough, unspecified: Secondary | ICD-10-CM | POA: Diagnosis present

## 2024-04-20 LAB — CBC WITH DIFFERENTIAL/PLATELET
Abs Immature Granulocytes: 0.08 10*3/uL — ABNORMAL HIGH (ref 0.00–0.07)
Basophils Absolute: 0 10*3/uL (ref 0.0–0.1)
Basophils Relative: 0 %
Eosinophils Absolute: 0 10*3/uL (ref 0.0–0.5)
Eosinophils Relative: 0 %
HCT: 42.5 % (ref 39.0–52.0)
Hemoglobin: 15 g/dL (ref 13.0–17.0)
Immature Granulocytes: 1 %
Lymphocytes Relative: 14 %
Lymphs Abs: 2 10*3/uL (ref 0.7–4.0)
MCH: 35 pg — ABNORMAL HIGH (ref 26.0–34.0)
MCHC: 35.3 g/dL (ref 30.0–36.0)
MCV: 99.3 fL (ref 80.0–100.0)
Monocytes Absolute: 0.8 10*3/uL (ref 0.1–1.0)
Monocytes Relative: 6 %
Neutro Abs: 11.4 10*3/uL — ABNORMAL HIGH (ref 1.7–7.7)
Neutrophils Relative %: 79 %
Platelets: 297 10*3/uL (ref 150–400)
RBC: 4.28 MIL/uL (ref 4.22–5.81)
RDW: 13.4 % (ref 11.5–15.5)
WBC: 14.4 10*3/uL — ABNORMAL HIGH (ref 4.0–10.5)
nRBC: 0 % (ref 0.0–0.2)

## 2024-04-20 LAB — COMPREHENSIVE METABOLIC PANEL WITH GFR
ALT: 20 U/L (ref 0–44)
AST: 26 U/L (ref 15–41)
Albumin: 3.2 g/dL — ABNORMAL LOW (ref 3.5–5.0)
Alkaline Phosphatase: 54 U/L (ref 38–126)
Anion gap: 12 (ref 5–15)
BUN: 14 mg/dL (ref 6–20)
CO2: 23 mmol/L (ref 22–32)
Calcium: 9.1 mg/dL (ref 8.9–10.3)
Chloride: 105 mmol/L (ref 98–111)
Creatinine, Ser: 1.19 mg/dL (ref 0.61–1.24)
GFR, Estimated: >60 mL/min
Glucose, Bld: 87 mg/dL (ref 70–99)
Potassium: 4.2 mmol/L (ref 3.5–5.1)
Sodium: 140 mmol/L (ref 135–145)
Total Bilirubin: 0.5 mg/dL (ref 0.0–1.2)
Total Protein: 6.8 g/dL (ref 6.5–8.1)

## 2024-04-20 LAB — RESP PANEL BY RT-PCR (RSV, FLU A&B, COVID)  RVPGX2
Influenza A by PCR: NEGATIVE
Influenza B by PCR: NEGATIVE
Resp Syncytial Virus by PCR: NEGATIVE
SARS Coronavirus 2 by RT PCR: NEGATIVE

## 2024-04-20 LAB — GROUP A STREP BY PCR: Group A Strep by PCR: NOT DETECTED

## 2024-04-20 LAB — TROPONIN I (HIGH SENSITIVITY): Troponin I (High Sensitivity): 3 ng/L (ref <18)

## 2024-04-20 LAB — MONONUCLEOSIS SCREEN: Mono Screen: NEGATIVE

## 2024-04-20 MED ORDER — DEXAMETHASONE 10 MG/ML FOR PEDIATRIC ORAL USE
10.0000 mg | Freq: Once | INTRAMUSCULAR | Status: AC
  Administered 2024-04-20: 10 mg via ORAL
  Filled 2024-04-20: qty 1

## 2024-04-20 MED ORDER — IPRATROPIUM-ALBUTEROL 0.5-2.5 (3) MG/3ML IN SOLN
6.0000 mL | Freq: Once | RESPIRATORY_TRACT | Status: AC
  Administered 2024-04-20: 6 mL via RESPIRATORY_TRACT
  Filled 2024-04-20: qty 6

## 2024-04-20 MED ORDER — ALBUTEROL SULFATE HFA 108 (90 BASE) MCG/ACT IN AERS: 2.0000 | INHALATION_SPRAY | Freq: Four times a day (QID) | RESPIRATORY_TRACT | 2 refills | Status: DC | PRN

## 2024-04-20 MED ORDER — ALBUTEROL SULFATE HFA 108 (90 BASE) MCG/ACT IN AERS: 2.0000 | INHALATION_SPRAY | Freq: Four times a day (QID) | RESPIRATORY_TRACT | 2 refills | Status: AC | PRN

## 2024-04-20 MED ORDER — KETOROLAC TROMETHAMINE 30 MG/ML IJ SOLN
15.0000 mg | Freq: Once | INTRAMUSCULAR | Status: AC
  Administered 2024-04-20: 15 mg via INTRAMUSCULAR
  Filled 2024-04-20: qty 1

## 2024-04-20 NOTE — Telephone Encounter (Signed)
 FYI Only or Action Required?: Action required by provider: update on patient condition.  Patient was last seen in primary care on .  Called Nurse Triage reporting Cough.  Symptoms began a week ago.  Interventions attempted: Prescription medications:   and Rest, hydration, or home remedies.  Symptoms are: gradually worsening. Cough and congestion has worsened.  Triage Disposition: Go to ED Now (Notify PCP)  Patient/caregiver understands and will follow disposition?: Yes      Copied from CRM [id number]. Topic: Clinical - Red Word Triage >> Apr 20, 2024  9:20 AM Nathanel DEL wrote: Red Word that prompted transfer to Nurse Triage: pt went to ED 2 days ago, and getting worse, chest is beginning to hurt, throat is really sore Reason for Disposition  Chest pain  (Exception: MILD central chest pain, present only when coughing.)  Answer Assessment - Initial Assessment Questions 1. ONSET: When did the cough begin?      Last week 2. SEVERITY: How bad is the cough today?      severe 3. SPUTUM: Describe the color of your sputum (e.g., none, dry cough; clear, white, yellow, green)     none 4. HEMOPTYSIS: Are you coughing up any blood? If Yes, ask: How much? (e.g., flecks, streaks, tablespoons, etc.)     no 5. DIFFICULTY BREATHING: Are you having difficulty breathing? If Yes, ask: How bad is it? (e.g., mild, moderate, severe)      mild 6. FEVER: Do you have a fever? If Yes, ask: What is your temperature, how was it measured, and when did it start?     sweats 7. CARDIAC HISTORY: Do you have any history of heart disease? (e.g., heart attack, congestive heart failure)      no 8. LUNG HISTORY: Do you have any history of lung disease?  (e.g., pulmonary embolus, asthma, emphysema)     no 9. PE RISK FACTORS: Do you have a history of blood clots? (or: recent major surgery, recent prolonged travel, bedridden)     no 10. OTHER SYMPTOMS: Do you have any other symptoms? (e.g.,  runny nose, wheezing, chest pain)       Chest pain, sore throat 11. PREGNANCY: Is there any chance you are pregnant? When was your last menstrual period?       N/a 12. TRAVEL: Have you traveled out of the country in the last month? (e.g., travel history, exposures)       no  Protocols used: Cough - Acute Non-Productive-A-AH

## 2024-04-20 NOTE — ED Provider Notes (Signed)
 Aaron Navarro Provider Note    Event Date/Time   First MD Initiated Contact with Patient 04/20/24 1407     (approximate)   History   Sore Throat   HPI  Aaron Navarro is a 33 y.o. male with a history of depression presenting with sore throat and bodyaches.  Has been ongoing for a week, was seen in the emergency department 2 days ago.  States that he has some ear aching and headache, still has a cough that is nonproductive, states that he has chest pain with the cough and especially when he swallows, he has some throat pain.  He denies any drug use or alcohol use, no history of HIV, states that he was tested a month or 2 ago and was negative.  Does not want to get repeat testing done here.  Does not follow with a primary care doctor.  States that he has history of childhood asthma but has not used an inhaler in at least 10 years.  No abdominal pain or lower extremity edema.  On independent chart review, he was seen in the emergency department on 29 September, had an extensive workup done including labs, troponin, respiratory viral panel, group A strep, blood cultures, CT soft tissue neck.  CT did not show any acute findings of epiglottitis or tonsillitis, no abscess, strep he was negative, respiratory viral panel is negative, electrolytes was not severely deranged, he was normal, troponin is normal.     Physical Exam   Triage Vital Signs: ED Triage Vitals  Encounter Vitals Group     BP 04/20/24 1355 135/82     Girls Systolic BP Percentile --      Girls Diastolic BP Percentile --      Boys Systolic BP Percentile --      Boys Diastolic BP Percentile --      Pulse Rate 04/20/24 1355 84     Resp 04/20/24 1355 20     Temp 04/20/24 1355 99 F (37.2 C)     Temp Source 04/20/24 1355 Oral     SpO2 04/20/24 1355 98 %     Weight 04/20/24 1352 130 lb (59 kg)     Height 04/20/24 1352 5' 6 (1.676 m)     Head Circumference --      Peak Flow --      Pain Score  04/20/24 1352 10     Pain Loc --      Pain Education --      Exclude from Growth Chart --     Most recent vital signs: Vitals:   04/20/24 1355 04/20/24 1437  BP: 135/82   Pulse: 84   Resp: 20   Temp: 99 F (37.2 C)   SpO2: 98% 98%     General: Awake, no distress.  CV:  Good peripheral perfusion.  Resp:  Normal effort.  Sparse wheezing on the right, no tachypnea Abd:  No distention.  Soft nontender Other:  Nontoxic-appearing, no nuchal rigidity, TMs are clear bilaterally, no mastoid tenderness or erythema, oropharynx is clear without tonsillar exudates, uvula is midline, no unilateral tonsillar swelling, no obvious rash.   ED Results / Procedures / Treatments   Labs (all labs ordered are listed, but only abnormal results are displayed) Labs Reviewed  COMPREHENSIVE METABOLIC PANEL WITH GFR - Abnormal; Notable for the following components:      Result Value   Albumin 3.2 (*)    All other components within normal limits  CBC  WITH DIFFERENTIAL/PLATELET - Abnormal; Notable for the following components:   WBC 14.4 (*)    MCH 35.0 (*)    Neutro Abs 11.4 (*)    Abs Immature Granulocytes 0.08 (*)    All other components within normal limits  GROUP A STREP BY PCR  RESP PANEL BY RT-PCR (RSV, FLU A&B, COVID)  [id number]  MONONUCLEOSIS SCREEN  TROPONIN I (HIGH SENSITIVITY)     EKG  EKG shows, sinus rhythm, rate 9 5, normal QS, normal QTc, no obvious ischemic ST elevation, peaked T waves on prior EKG not present on this 1.   RADIOLOGY On my independent interpretation, chest x-ray without obvious consolidation   PROCEDURES:  Critical Care performed: No  Procedures   MEDICATIONS ORDERED IN ED: Medications  ipratropium-albuterol (DUONEB) 0.5-2.5 (3) MG/3ML nebulizer solution 6 mL (6 mLs Nebulization Given 04/20/24 1425)  dexamethasone (DECADRON) 10 MG/ML injection for Pediatric ORAL use 10 mg (10 mg Oral Given 04/20/24 1426)  ketorolac (TORADOL) 30 MG/ML injection 15 mg (15  mg Intramuscular Given 04/20/24 1425)     IMPRESSION / MDM / ASSESSMENT AND PLAN / ED COURSE  I reviewed the triage vital signs and the nursing notes.                              Differential diagnosis includes, but is not limited to, viral illness, pharyngitis, mono, electrolyte derangements, musculoskeletal pain, strain, costochondritis, reactive airway disease, asthma, pneumonia, ACS, did consider myocarditis but patient is not tachycardic at this time, chest pain seems reasonable related to the cough.  Will get labs, EKG, troponin, chest x-ray, strep and respiratory swab was obtained out of triage, will add a Monospot.  Will give him some DuoNebs, Decadron.  Reassess  Social determinants of health, lack of primary care.  Patient's presentation is most consistent with acute presentation with potential threat to life or bodily function.  Independent interpretation of labs and imaging below.  On reassessment the DuoNebs were helping with cough and shortness of breath, discussed with her about imaging and lab results, workup is reassuring.  Lungs are clear now.  Not hypoxic or tachypneic, his work of breathing.  Considered but no indication for inpatient admission at this time, he safe for outpatient management.  Will give him primary care doctor referral as well as a prescription for albuterol.  Encouraged hydration.  Discussed with him about following up next week to get reassessed.  Otherwise considered but no indication for inpatient mission at this time, he safe for outpatient management.  Will discharge with strict return precautions.  Shared decision making by patient and he is agreeable with this plan.  The patient is on the cardiac monitor to evaluate for evidence of arrhythmia and/or significant heart rate changes.   Clinical Course as of 04/20/24 1630  Wed Apr 20, 2024  1528 Independent review of labs, leukocytosis noted, troponin is not elevated, electrolytes not severely deranged,  LFTs are normal [TT]  1539 Mono Screen: NEGATIVE [TT]  1539 Group A Strep by PCR: NOT DETECTED [TT]  1539 Resp panel by RT-PCR (RSV, Flu A&B, Covid) Anterior Nasal Swab Negative [TT]  1627 DG Chest 1 View No acute findings.  [TT]    Clinical Course User Index [TT] Waymond Lorelle Cummins, MD     FINAL CLINICAL IMPRESSION(S) / ED DIAGNOSES   Final diagnoses:  Wheezing  Viral illness  Viral pharyngitis     Rx / DC Orders  ED Discharge Orders          Ordered    Ambulatory Referral to Primary Care (Establish Care)        04/20/24 1626    albuterol (VENTOLIN HFA) 108 (90 Base) MCG/ACT inhaler  Every 6 hours PRN        04/20/24 1627             Note:  This document was prepared using Dragon voice recognition software and may include unintentional dictation errors.    Waymond Lorelle Cummins, MD 04/20/24 [phone]

## 2024-04-20 NOTE — ED Triage Notes (Signed)
 Patient states cough, sore throat and body aches for 9 days; states he was seen here 2 days ago for the same.

## 2024-04-20 NOTE — Discharge Instructions (Signed)
 Please take the albuterol as prescribed as needed for your wheezing.  I put in a referral for you for primary care, make sure to follow-up.  Otherwise keep her self hydrated.

## 2024-04-23 LAB — CULTURE, BLOOD (ROUTINE X 2)
Culture: NO GROWTH
Culture: NO GROWTH
Special Requests: ADEQUATE
